# Patient Record
Sex: Female | Born: 1940 | Race: White | Hispanic: No | Marital: Married | Smoking: Former smoker
Health system: Southern US, Community
[De-identification: ages and names within clinical notes are randomized; demographics above are authoritative.]

## PROBLEM LIST (undated history)

## (undated) DIAGNOSIS — F419 Anxiety disorder, unspecified: Secondary | ICD-10-CM

## (undated) DIAGNOSIS — K219 Gastro-esophageal reflux disease without esophagitis: Secondary | ICD-10-CM

## (undated) DIAGNOSIS — E785 Hyperlipidemia, unspecified: Secondary | ICD-10-CM

## (undated) DIAGNOSIS — F32A Depression, unspecified: Secondary | ICD-10-CM

## (undated) DIAGNOSIS — F329 Major depressive disorder, single episode, unspecified: Secondary | ICD-10-CM

## (undated) DIAGNOSIS — M549 Dorsalgia, unspecified: Secondary | ICD-10-CM

## (undated) DIAGNOSIS — M810 Age-related osteoporosis without current pathological fracture: Secondary | ICD-10-CM

## (undated) DIAGNOSIS — I10 Essential (primary) hypertension: Secondary | ICD-10-CM

## (undated) HISTORY — PX: TUBAL LIGATION: SHX77

## (undated) HISTORY — PX: BLADDER SUSPENSION: SHX72

## (undated) HISTORY — PX: DILATION AND CURETTAGE OF UTERUS: SHX78

## (undated) HISTORY — PX: COLONOSCOPY: SHX174

## (undated) HISTORY — PX: OTHER SURGICAL HISTORY: SHX169

## (undated) HISTORY — PX: ABDOMINAL HYSTERECTOMY: SHX81

---

## 2004-04-11 ENCOUNTER — Ambulatory Visit: Payer: Self-pay | Admitting: Unknown Physician Specialty

## 2004-05-06 ENCOUNTER — Ambulatory Visit: Payer: Self-pay | Admitting: Unknown Physician Specialty

## 2004-05-09 ENCOUNTER — Ambulatory Visit: Payer: Self-pay | Admitting: Unknown Physician Specialty

## 2005-07-10 ENCOUNTER — Ambulatory Visit: Payer: Self-pay | Admitting: Unknown Physician Specialty

## 2006-07-14 ENCOUNTER — Ambulatory Visit: Payer: Self-pay | Admitting: Unknown Physician Specialty

## 2007-01-25 ENCOUNTER — Ambulatory Visit: Payer: Self-pay | Admitting: Unknown Physician Specialty

## 2007-07-16 ENCOUNTER — Ambulatory Visit: Payer: Self-pay | Admitting: Unknown Physician Specialty

## 2008-07-19 ENCOUNTER — Ambulatory Visit: Payer: Self-pay | Admitting: Unknown Physician Specialty

## 2008-09-21 ENCOUNTER — Emergency Department: Payer: Self-pay | Admitting: Emergency Medicine

## 2009-03-06 ENCOUNTER — Ambulatory Visit: Payer: Self-pay | Admitting: Unknown Physician Specialty

## 2009-08-01 ENCOUNTER — Ambulatory Visit: Payer: Self-pay

## 2009-08-17 ENCOUNTER — Emergency Department: Payer: Self-pay | Admitting: Emergency Medicine

## 2009-08-22 ENCOUNTER — Ambulatory Visit: Payer: Self-pay | Admitting: Unknown Physician Specialty

## 2009-08-29 ENCOUNTER — Ambulatory Visit: Payer: Self-pay | Admitting: Unknown Physician Specialty

## 2009-08-31 ENCOUNTER — Ambulatory Visit: Payer: Self-pay | Admitting: Unknown Physician Specialty

## 2009-09-03 ENCOUNTER — Ambulatory Visit: Payer: Self-pay | Admitting: Unknown Physician Specialty

## 2009-09-05 ENCOUNTER — Inpatient Hospital Stay: Payer: Self-pay | Admitting: Unknown Physician Specialty

## 2009-09-25 ENCOUNTER — Emergency Department: Payer: Self-pay | Admitting: Emergency Medicine

## 2010-08-05 ENCOUNTER — Ambulatory Visit: Payer: Self-pay | Admitting: Unknown Physician Specialty

## 2011-08-06 ENCOUNTER — Ambulatory Visit: Payer: Self-pay | Admitting: Internal Medicine

## 2012-08-10 ENCOUNTER — Ambulatory Visit: Payer: Self-pay | Admitting: Unknown Physician Specialty

## 2012-09-17 ENCOUNTER — Ambulatory Visit: Payer: Self-pay | Admitting: Internal Medicine

## 2014-03-02 ENCOUNTER — Ambulatory Visit: Payer: Self-pay | Admitting: Internal Medicine

## 2014-07-13 ENCOUNTER — Ambulatory Visit: Admit: 2014-07-13 | Disposition: A | Discharge status: Home / Self Care | Payer: Self-pay | Admitting: Internal Medicine

## 2014-07-13 ENCOUNTER — Ambulatory Visit
Admit: 2014-07-13 | Disposition: A | Discharge status: Home / Self Care | Payer: Self-pay | Attending: Internal Medicine | Admitting: Internal Medicine

## 2014-07-26 ENCOUNTER — Ambulatory Visit: Admit: 2014-07-26 | Disposition: A | Discharge status: Home / Self Care | Payer: Self-pay | Admitting: Internal Medicine

## 2016-06-25 ENCOUNTER — Other Ambulatory Visit: Payer: Self-pay | Admitting: Internal Medicine

## 2016-06-25 DIAGNOSIS — Z1231 Encounter for screening mammogram for malignant neoplasm of breast: Secondary | ICD-10-CM

## 2016-07-28 ENCOUNTER — Ambulatory Visit
Admission: RE | Admit: 2016-07-28 | Discharge: 2016-07-28 | Disposition: A | Discharge status: Home / Self Care | Payer: Medicare Other | Source: Ambulatory Visit | Attending: Internal Medicine | Admitting: Internal Medicine

## 2016-07-28 DIAGNOSIS — Z1231 Encounter for screening mammogram for malignant neoplasm of breast: Secondary | ICD-10-CM | POA: Diagnosis present

## 2017-09-02 ENCOUNTER — Other Ambulatory Visit: Payer: Self-pay | Admitting: Internal Medicine

## 2017-09-02 DIAGNOSIS — Z1231 Encounter for screening mammogram for malignant neoplasm of breast: Secondary | ICD-10-CM

## 2017-09-21 ENCOUNTER — Ambulatory Visit
Admission: RE | Admit: 2017-09-21 | Discharge: 2017-09-21 | Disposition: A | Discharge status: Home / Self Care | Payer: Medicare Other | Source: Ambulatory Visit | Attending: Internal Medicine | Admitting: Internal Medicine

## 2017-09-21 DIAGNOSIS — Z1231 Encounter for screening mammogram for malignant neoplasm of breast: Secondary | ICD-10-CM

## 2018-05-07 ENCOUNTER — Encounter: Payer: Self-pay | Admitting: *Deleted

## 2018-05-10 ENCOUNTER — Ambulatory Visit: Payer: Medicare Other | Admitting: Anesthesiology

## 2018-05-10 ENCOUNTER — Ambulatory Visit
Admission: RE | Admit: 2018-05-10 | Discharge: 2018-05-10 | Disposition: A | Discharge status: Home / Self Care | Payer: Medicare Other | Attending: Unknown Physician Specialty | Admitting: Unknown Physician Specialty

## 2018-05-10 ENCOUNTER — Encounter: Payer: Self-pay | Admitting: Anesthesiology

## 2018-05-10 ENCOUNTER — Encounter
Admission: RE | Disposition: A | Discharge status: Home / Self Care | Payer: Self-pay | Source: Home / Self Care | Attending: Unknown Physician Specialty

## 2018-05-10 DIAGNOSIS — K640 First degree hemorrhoids: Secondary | ICD-10-CM | POA: Insufficient documentation

## 2018-05-10 DIAGNOSIS — F329 Major depressive disorder, single episode, unspecified: Secondary | ICD-10-CM | POA: Insufficient documentation

## 2018-05-10 DIAGNOSIS — F419 Anxiety disorder, unspecified: Secondary | ICD-10-CM | POA: Diagnosis not present

## 2018-05-10 DIAGNOSIS — Z1211 Encounter for screening for malignant neoplasm of colon: Secondary | ICD-10-CM | POA: Insufficient documentation

## 2018-05-10 DIAGNOSIS — Z87891 Personal history of nicotine dependence: Secondary | ICD-10-CM | POA: Diagnosis not present

## 2018-05-10 DIAGNOSIS — K5730 Diverticulosis of large intestine without perforation or abscess without bleeding: Secondary | ICD-10-CM | POA: Insufficient documentation

## 2018-05-10 DIAGNOSIS — I10 Essential (primary) hypertension: Secondary | ICD-10-CM | POA: Diagnosis not present

## 2018-05-10 DIAGNOSIS — Z86010 Personal history of colonic polyps: Secondary | ICD-10-CM | POA: Insufficient documentation

## 2018-05-10 DIAGNOSIS — Z79899 Other long term (current) drug therapy: Secondary | ICD-10-CM | POA: Diagnosis not present

## 2018-05-10 HISTORY — DX: Gastro-esophageal reflux disease without esophagitis: K21.9

## 2018-05-10 HISTORY — DX: Major depressive disorder, single episode, unspecified: F32.9

## 2018-05-10 HISTORY — DX: Anxiety disorder, unspecified: F41.9

## 2018-05-10 HISTORY — DX: Dorsalgia, unspecified: M54.9

## 2018-05-10 HISTORY — DX: Hyperlipidemia, unspecified: E78.5

## 2018-05-10 HISTORY — DX: Age-related osteoporosis without current pathological fracture: M81.0

## 2018-05-10 HISTORY — PX: COLONOSCOPY WITH PROPOFOL: SHX5780

## 2018-05-10 HISTORY — DX: Essential (primary) hypertension: I10

## 2018-05-10 HISTORY — DX: Depression, unspecified: F32.A

## 2018-05-10 SURGERY — COLONOSCOPY WITH PROPOFOL
Anesthesia: General

## 2018-05-10 MED ORDER — SODIUM CHLORIDE 0.9 % IV SOLN: INTRAVENOUS | Status: DC

## 2018-05-10 MED ORDER — PROPOFOL 10 MG/ML IV BOLUS
INTRAVENOUS | Status: DC | PRN
  Administered 2018-05-10: 20 mg via INTRAVENOUS
  Administered 2018-05-10: 50 mg via INTRAVENOUS

## 2018-05-10 MED ORDER — LIDOCAINE HCL (CARDIAC) PF 100 MG/5ML IV SOSY
PREFILLED_SYRINGE | INTRAVENOUS | Status: DC | PRN
  Administered 2018-05-10: 60 mg via INTRAVENOUS

## 2018-05-10 MED ORDER — PROPOFOL 500 MG/50ML IV EMUL
INTRAVENOUS | Status: AC
  Filled 2018-05-10: qty 50

## 2018-05-10 MED ORDER — LIDOCAINE HCL (PF) 2 % IJ SOLN
INTRAMUSCULAR | Status: AC
  Filled 2018-05-10: qty 10

## 2018-05-10 MED ORDER — SODIUM CHLORIDE 0.9 % IV SOLN
INTRAVENOUS | Status: DC
  Administered 2018-05-10: 15:00:00 via INTRAVENOUS

## 2018-05-10 MED ORDER — PROPOFOL 500 MG/50ML IV EMUL
INTRAVENOUS | Status: DC | PRN
  Administered 2018-05-10: 120 ug/kg/min via INTRAVENOUS

## 2018-05-10 NOTE — H&P (Signed)
Primary Care Physician:  Barbette ReichmannHande, Vishwanath, MD Primary Gastroenterologist:  Dr. Mechele CollinElliott  Pre-Procedure History & Physical: HPI:  Monica Franco is a 78 y.o. female is here for an colonoscopy.   Past Medical History:  Diagnosis Date  . Anxiety   . Back pain   . Depression   . Elevated lipids   . GERD (gastroesophageal reflux disease)   . Hypertension   . Osteoporosis     Past Surgical History:  Procedure Laterality Date  . ABDOMINAL HYSTERECTOMY    . BLADDER SUSPENSION    . COLONOSCOPY    . DILATION AND CURETTAGE OF UTERUS    . sigmoidscopy    . TUBAL LIGATION      Prior to Admission medications   Medication Sig Start Date End Date Taking? Authorizing Provider  clonazePAM (KLONOPIN) 0.5 MG tablet Take 0.5 mg by mouth 2 (two) times daily as needed for anxiety.   Yes [provider]  clotrimazole (LOTRIMIN) 1 % external solution Apply 1 application topically 2 (two) times daily.   Yes [provider]  ezetimibe (ZETIA) 10 MG tablet Take 10 mg by mouth daily.   Yes [provider]  hydrochlorothiazide (HYDRODIURIL) 25 MG tablet Take 25 mg by mouth daily.   Yes [provider]  losartan (COZAAR) 100 MG tablet Take 100 mg by mouth daily.   Yes [provider]  oxyCODONE-acetaminophen (PERCOCET/ROXICET) 5-325 MG tablet Take by mouth every 4 (four) hours as needed for severe pain.   Yes [provider]  PARoxetine (PAXIL) 30 MG tablet Take 30 mg by mouth daily.   Yes [provider]  potassium chloride (K-DUR,KLOR-CON) 10 MEQ tablet Take 10 mEq by mouth 2 (two) times daily.   Yes [provider]  pravastatin (PRAVACHOL) 20 MG tablet Take 20 mg by mouth daily.   Yes [provider]  triamcinolone cream (KENALOG) 0.5 % Apply 1 application topically 3 (three) times daily.   Yes [provider]    Allergies as of 05/03/2018  . (Not on File)    Family History  Problem Relation Age of  Onset  . Breast cancer Maternal Grandmother     Social History   Socioeconomic History  . Marital status: Married    Spouse name: Not on file  . Number of children: Not on file  . Years of education: Not on file  . Highest education level: Not on file  Occupational History  . Not on file  Social Needs  . Financial resource strain: Not on file  . Food insecurity:    Worry: Not on file    Inability: Not on file  . Transportation needs:    Medical: Not on file    Non-medical: Not on file  Tobacco Use  . Smoking status: Former Games developermoker  . Smokeless tobacco: Never Used  Substance and Sexual Activity  . Alcohol use: Never    Frequency: Never  . Drug use: Never  . Sexual activity: Not on file  Lifestyle  . Physical activity:    Days per week: Not on file    Minutes per session: Not on file  . Stress: Not on file  Relationships  . Social connections:    Talks on phone: Not on file    Gets together: Not on file    Attends religious service: Not on file    Active member of club or organization: Not on file    Attends meetings of clubs or organizations: Not on  file    Relationship status: Not on file  . Intimate partner violence:    Fear of current or ex partner: Not on file    Emotionally abused: Not on file    Physically abused: Not on file    Forced sexual activity: Not on file  Other Topics Concern  . Not on file  Social History Narrative  . Not on file    Review of Systems: See HPI, otherwise negative ROS  Physical Exam: BP (!) 155/84   Pulse 76   Temp (!) 96.5 F (35.8 C) (Tympanic)   Resp 20   Ht 5\' 1"  (1.549 m)   Wt 63.5 kg   SpO2 100%   BMI 26.45 kg/m  General:   Alert,  pleasant and cooperative in NAD Head:  Normocephalic and atraumatic. Neck:  Supple; no masses or thyromegaly. Lungs:  Clear throughout to auscultation.    Heart:  Regular rate and rhythm. Abdomen:  Soft, nontender and nondistended. Normal bowel sounds, without guarding, and without  rebound.   Neurologic:  Alert and  oriented x4;  grossly normal neurologically.  Impression/Plan: Monica Franco is here for an colonoscopy to be performed for [facility] colon polyps.  Risks, benefits, limitations, and alternatives regarding  colonoscopy have been reviewed with the patient.  Questions have been answered.  All parties agreeable.   Lynnae Prude, MD  05/10/2018, 3:43 PM

## 2018-05-10 NOTE — Anesthesia Post-op Follow-up Note (Signed)
Anesthesia QCDR form completed.        

## 2018-05-10 NOTE — Transfer of Care (Signed)
Immediate Anesthesia Transfer of Care Note  Patient: Monica Franco  Procedure(s) Performed: COLONOSCOPY WITH PROPOFOL (N/A )  Patient Location: PACU  Anesthesia Type:General  Level of Consciousness: sedated  Airway & Oxygen Therapy: Patient Spontanous Breathing and Patient connected to nasal cannula oxygen  Post-op Assessment: Report given to RN and Post -op Vital signs reviewed and stable  Post vital signs: Reviewed and stable  Last Vitals:  Vitals Value Taken Time  BP 153/76 05/10/2018  4:20 PM  Temp 36.1 C 05/10/2018  4:20 PM  Pulse 59 05/10/2018  4:21 PM  Resp 19 05/10/2018  4:21 PM  SpO2 100 % 05/10/2018  4:21 PM  Vitals shown include unvalidated device data.  Last Pain:  Vitals:   05/10/18 1620  TempSrc: Tympanic  PainSc:       Patients Stated Pain Goal: 0 (05/10/18 1458)  Complications: No apparent anesthesia complications

## 2018-05-10 NOTE — Anesthesia Postprocedure Evaluation (Signed)
Anesthesia Post Note  Patient: Monica Franco  Procedure(s) Performed: COLONOSCOPY WITH PROPOFOL (N/A )  Patient location during evaluation: Endoscopy Anesthesia Type: General Level of consciousness: awake and alert Pain management: pain level controlled Vital Signs Assessment: post-procedure vital signs reviewed and stable Respiratory status: spontaneous breathing and respiratory function stable Cardiovascular status: stable Anesthetic complications: no     Last Vitals:  Vitals:   05/10/18 1501 05/10/18 1620  BP:  (!) 153/76  Pulse: 76 60  Resp: 20 (!) 21  Temp:  (!) 36.1 C  SpO2:  100%    Last Pain:  Vitals:   05/10/18 1620  TempSrc: Tympanic  PainSc:                  KEPHART,WILLIAM K

## 2018-05-10 NOTE — Progress Notes (Signed)
IV site without bleeding when IV dc'd. Noted small hematoma at left hand IV site after patient dressed for discharge. PRessure held for 10 min. To left hand, And small ice pack applied. Bruise noted and bandage applied. Pt. Knows to call MD if site doesn't heal over the next week. She knows there will be a bruise there, and agrees.

## 2018-05-10 NOTE — Anesthesia Preprocedure Evaluation (Signed)
Anesthesia Evaluation  Patient identified by MRN, date of birth, ID band Patient awake    Reviewed: Allergy & Precautions, NPO status , Patient's Chart, lab work & pertinent test results, reviewed documented beta blocker date and time   Airway Mallampati: II  TM Distance: >3 FB     Dental  (+) Chipped   Pulmonary former smoker,           Cardiovascular hypertension, Pt. on medications      Neuro/Psych Anxiety    GI/Hepatic   Endo/Other    Renal/GU      Musculoskeletal   Abdominal   Peds  Hematology   Anesthesia Other Findings   Reproductive/Obstetrics                             Anesthesia Physical Anesthesia Plan  ASA: II  Anesthesia Plan: General   Post-op Pain Management:    Induction: Intravenous  PONV Risk Score and Plan:   Airway Management Planned:   Additional Equipment:   Intra-op Plan:   Post-operative Plan:   Informed Consent: I have reviewed the patients History and Physical, chart, labs and discussed the procedure including the risks, benefits and alternatives for the proposed anesthesia with the patient or authorized representative who has indicated his/her understanding and acceptance.       Plan Discussed with: CRNA  Anesthesia Plan Comments:         Anesthesia Quick Evaluation

## 2018-05-10 NOTE — Op Note (Signed)
[facility] Gastroenterology Patient Name: Saidie Layer Procedure Date: 05/10/2018 3:50 PM MRN: [id number] Account #: [id number] Date of Birth: 04-17-1940 Admit Type: Outpatient Age: 78 Room: [facility] ENDO ROOM 1 Gender: Female Note Status: Finalized Procedure:            Colonoscopy Indications:          High risk colon cancer surveillance: Personal history                        of colonic polyps Providers:            Scot Jun, MD Medicines:            Propofol per Anesthesia Complications:        No immediate complications. Procedure:            Pre-Anesthesia Assessment:                       - After reviewing the risks and benefits, the patient                        was deemed in satisfactory condition to undergo the                        procedure.                       After obtaining informed consent, the colonoscope was                        passed under direct vision. Throughout the procedure,                        the patient's blood pressure, pulse, and oxygen                        saturations were monitored continuously. The                        colonoscopy was performed without difficulty. The                        patient tolerated the procedure well. The quality of                        the bowel preparation was good. The was introduced                        through the anus and advanced to the the cecum,                        identified by appendiceal orifice and ileocecal valve. Findings:      The patient scope was passed to the cecum and the two assistants       verified this. Picture inadvertantly not done.      A few small-mouthed diverticula were found in the sigmoid colon.      Internal hemorrhoids were found during endoscopy. The hemorrhoids were       small and Grade I (internal hemorrhoids that do not prolapse).      The exam was otherwise without abnormality. Impression:           -  Diverticulosis in the sigmoid  colon.                       - Internal hemorrhoids.                       - The examination was otherwise normal.                       - No specimens collected. Recommendation:       - Repeat colonoscopy in 5 years for surveillance. If                        health not in good shape then do not repeat the test. Scot Junobert T Elliott, MD 05/10/2018 4:25:24 PM This report has been signed electronically. Number of Addenda: 0 Note Initiated On: 05/10/2018 3:50 PM Scope Withdrawal Time: 0 hours 7 minutes 30 seconds  Total Procedure Duration: 0 hours 19 minutes 36 seconds       [facility]

## 2018-05-12 ENCOUNTER — Encounter: Payer: Self-pay | Admitting: Unknown Physician Specialty

## 2018-10-27 ENCOUNTER — Other Ambulatory Visit: Payer: Self-pay | Admitting: Internal Medicine

## 2018-10-27 DIAGNOSIS — Z1231 Encounter for screening mammogram for malignant neoplasm of breast: Secondary | ICD-10-CM

## 2018-12-17 ENCOUNTER — Ambulatory Visit
Admission: RE | Admit: 2018-12-17 | Discharge: 2018-12-17 | Disposition: A | Discharge status: Home / Self Care | Payer: Medicare Other | Source: Ambulatory Visit | Attending: Internal Medicine | Admitting: Internal Medicine

## 2018-12-17 DIAGNOSIS — Z1231 Encounter for screening mammogram for malignant neoplasm of breast: Secondary | ICD-10-CM | POA: Insufficient documentation

## 2019-07-27 ENCOUNTER — Ambulatory Visit: Payer: Medicare Other | Admitting: Dermatology

## 2020-01-19 ENCOUNTER — Other Ambulatory Visit: Payer: Self-pay | Admitting: Internal Medicine

## 2020-01-19 DIAGNOSIS — Z1231 Encounter for screening mammogram for malignant neoplasm of breast: Secondary | ICD-10-CM

## 2020-03-23 ENCOUNTER — Ambulatory Visit
Admission: RE | Admit: 2020-03-23 | Discharge: 2020-03-23 | Disposition: A | Discharge status: Home / Self Care | Payer: Medicare Other | Source: Ambulatory Visit | Attending: Internal Medicine | Admitting: Internal Medicine

## 2020-03-23 ENCOUNTER — Other Ambulatory Visit: Payer: Self-pay

## 2020-03-23 DIAGNOSIS — Z1231 Encounter for screening mammogram for malignant neoplasm of breast: Secondary | ICD-10-CM | POA: Diagnosis present

## 2020-10-13 IMAGING — MG MM DIGITAL SCREENING BILAT W/ TOMO W/ CAD
6 of 12 series · 6 of 36 positions shown · non-contrast
Comparison: Previous exam(s).

CLINICAL DATA: Screening.

EXAM:
DIGITAL SCREENING BILATERAL MAMMOGRAM WITH TOMO AND CAD

[R MLO synth-2D (1 of 2)]
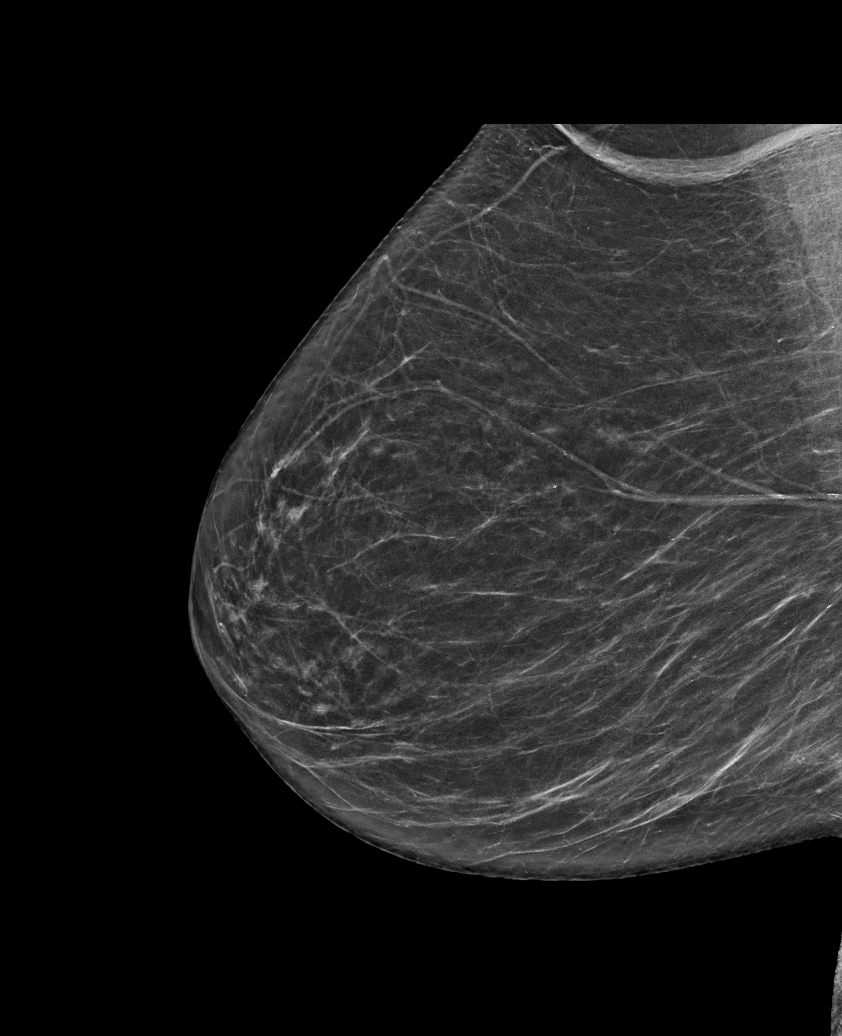

[R CC synth-2D]
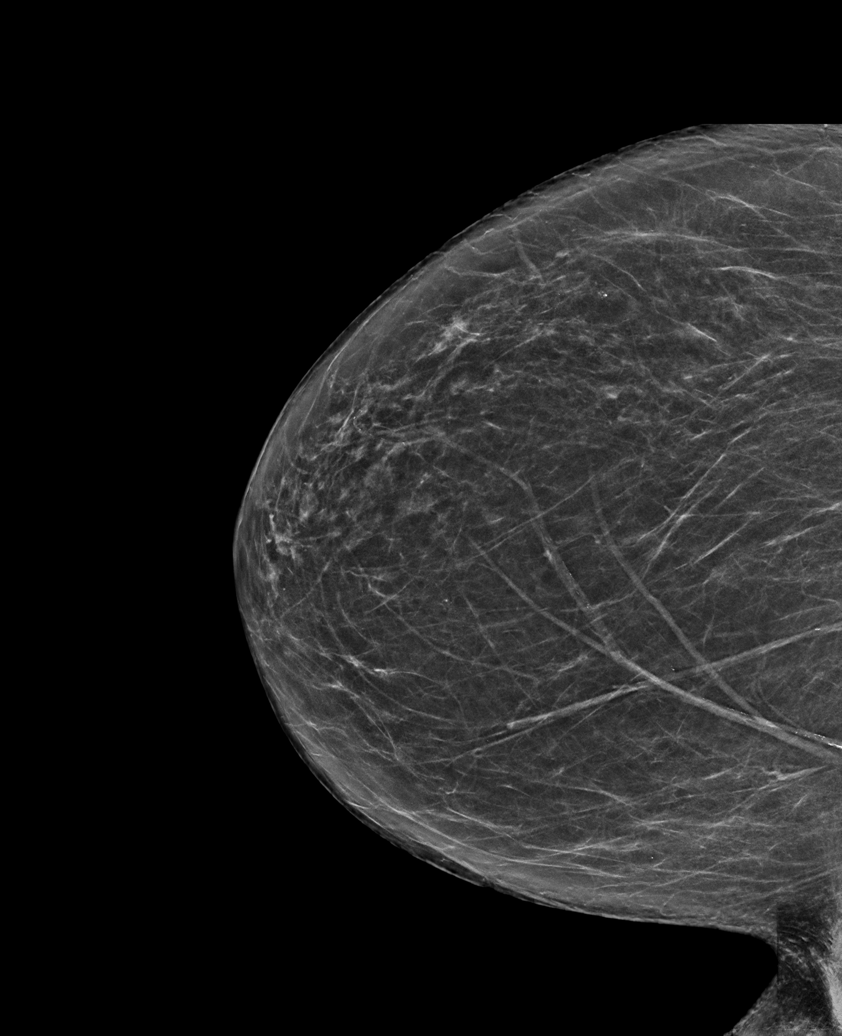

[L CC synth-2D]
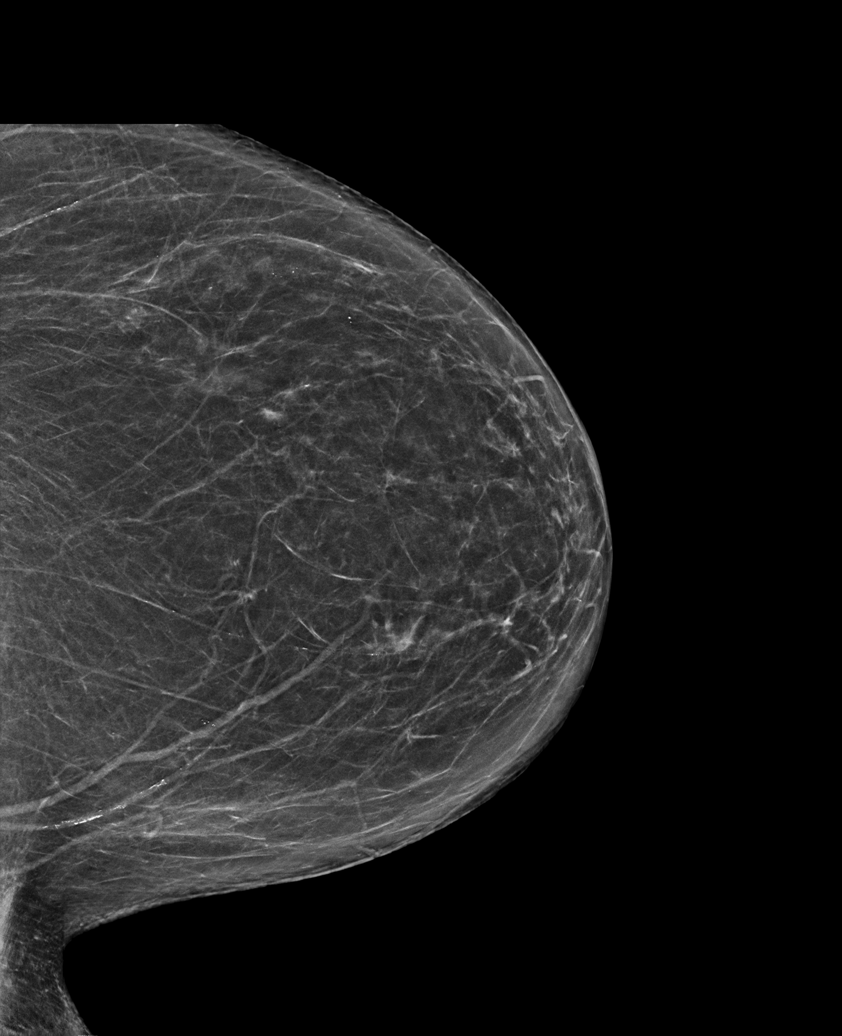

[L MLO synth-2D (1 of 2)]
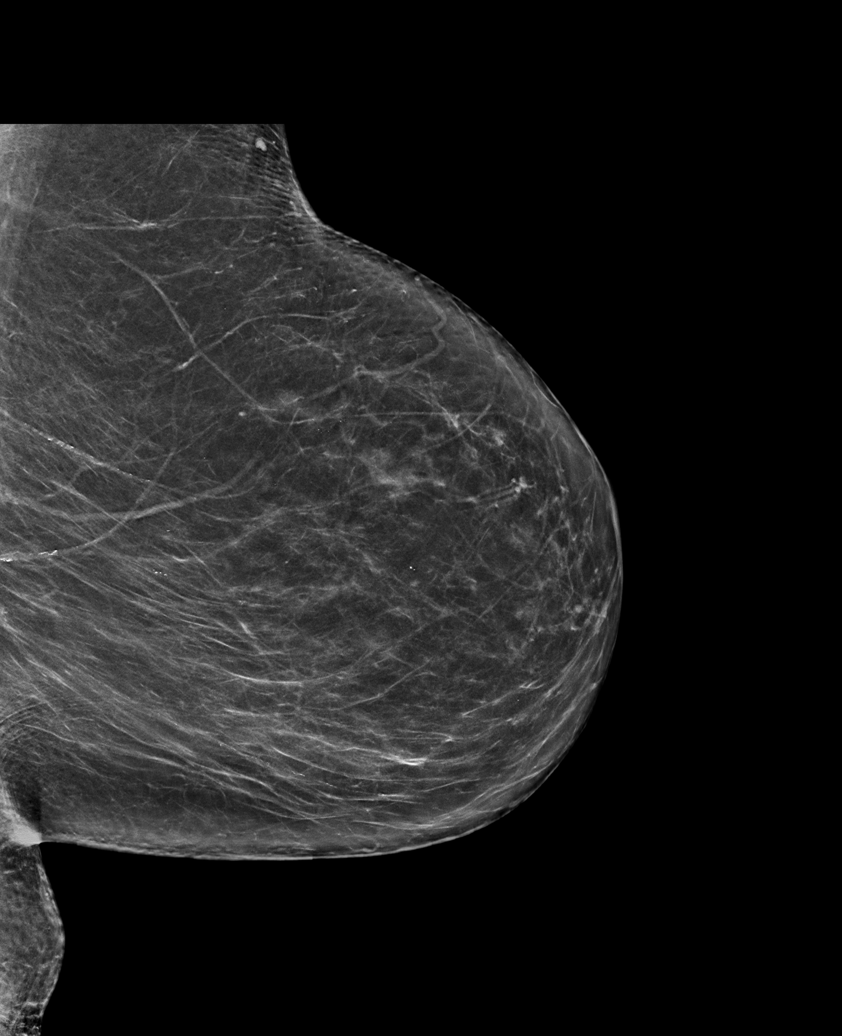

[L MLO synth-2D (2 of 2)]
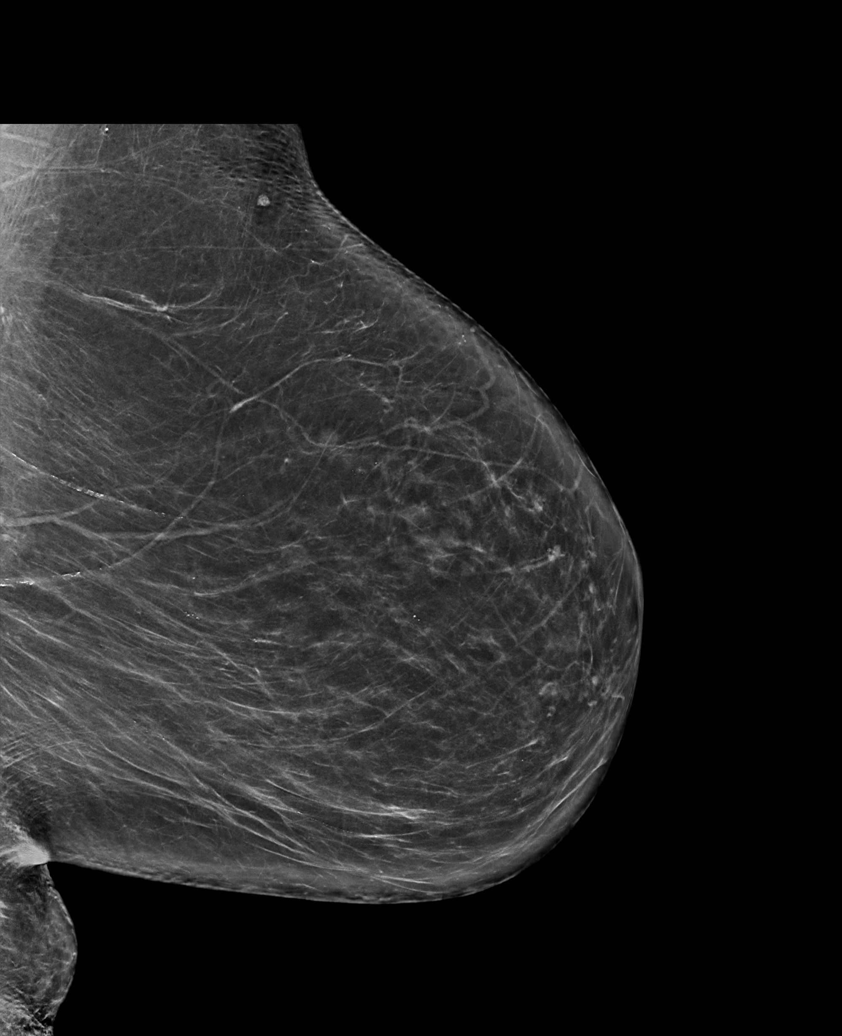

[R MLO synth-2D (2 of 2)]
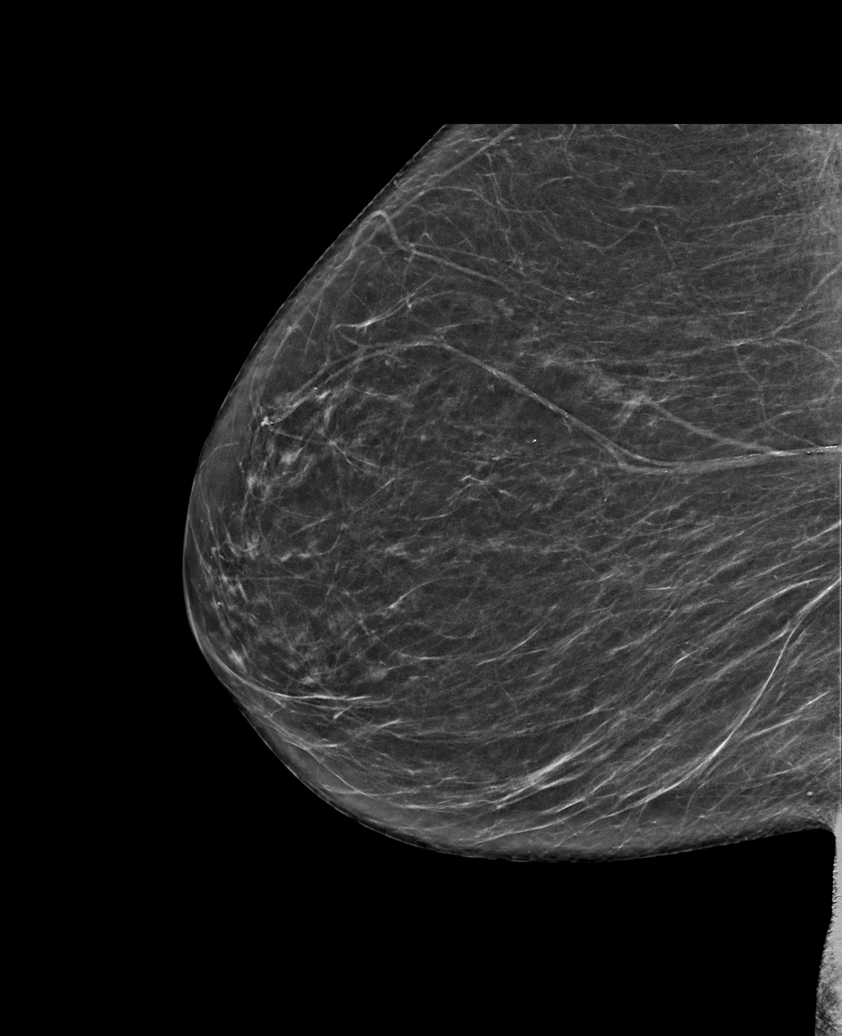

[6 of 36 positions shown; findings below may reference images not displayed]

ACR Breast Density Category b: There are scattered areas of
fibroglandular density.
FINDINGS: There are no findings suspicious for malignancy. Images were
processed with CAD.
IMPRESSION: No mammographic evidence of malignancy. A result letter of this
screening mammogram will be mailed directly to the patient.

RECOMMENDATION:
Screening mammogram in one year. (Code:[id number])

BI-RADS CATEGORY  1: Negative.

## 2021-03-05 ENCOUNTER — Other Ambulatory Visit: Payer: Self-pay | Admitting: Internal Medicine

## 2021-03-05 DIAGNOSIS — Z1231 Encounter for screening mammogram for malignant neoplasm of breast: Secondary | ICD-10-CM

## 2021-03-28 ENCOUNTER — Other Ambulatory Visit: Payer: Self-pay

## 2021-03-28 ENCOUNTER — Ambulatory Visit
Admission: RE | Admit: 2021-03-28 | Discharge: 2021-03-28 | Disposition: A | Discharge status: Home / Self Care | Payer: Medicare Other | Source: Ambulatory Visit | Attending: Internal Medicine | Admitting: Internal Medicine

## 2021-03-28 DIAGNOSIS — Z1231 Encounter for screening mammogram for malignant neoplasm of breast: Secondary | ICD-10-CM | POA: Insufficient documentation

## 2022-02-17 ENCOUNTER — Other Ambulatory Visit: Payer: Self-pay

## 2022-02-17 DIAGNOSIS — Z1231 Encounter for screening mammogram for malignant neoplasm of breast: Secondary | ICD-10-CM

## 2022-05-23 ENCOUNTER — Ambulatory Visit
Admission: RE | Admit: 2022-05-23 | Discharge: 2022-05-23 | Disposition: A | Discharge status: Home / Self Care | Payer: Medicare Other | Source: Ambulatory Visit | Attending: Internal Medicine | Admitting: Internal Medicine

## 2022-05-23 DIAGNOSIS — Z1231 Encounter for screening mammogram for malignant neoplasm of breast: Secondary | ICD-10-CM | POA: Diagnosis not present

## 2023-07-07 ENCOUNTER — Other Ambulatory Visit: Payer: Self-pay | Admitting: Internal Medicine

## 2023-07-07 DIAGNOSIS — Z1231 Encounter for screening mammogram for malignant neoplasm of breast: Secondary | ICD-10-CM

## 2023-07-29 ENCOUNTER — Encounter: Payer: Self-pay | Admitting: Dermatology

## 2023-07-29 ENCOUNTER — Ambulatory Visit: Payer: Medicare Other | Admitting: Dermatology

## 2023-07-29 DIAGNOSIS — L304 Erythema intertrigo: Secondary | ICD-10-CM

## 2023-07-29 DIAGNOSIS — L578 Other skin changes due to chronic exposure to nonionizing radiation: Secondary | ICD-10-CM | POA: Diagnosis not present

## 2023-07-29 DIAGNOSIS — L821 Other seborrheic keratosis: Secondary | ICD-10-CM

## 2023-07-29 DIAGNOSIS — D1801 Hemangioma of skin and subcutaneous tissue: Secondary | ICD-10-CM

## 2023-07-29 DIAGNOSIS — L2089 Other atopic dermatitis: Secondary | ICD-10-CM

## 2023-07-29 DIAGNOSIS — L820 Inflamed seborrheic keratosis: Secondary | ICD-10-CM

## 2023-07-29 DIAGNOSIS — Z1283 Encounter for screening for malignant neoplasm of skin: Secondary | ICD-10-CM | POA: Diagnosis not present

## 2023-07-29 DIAGNOSIS — L814 Other melanin hyperpigmentation: Secondary | ICD-10-CM

## 2023-07-29 DIAGNOSIS — D229 Melanocytic nevi, unspecified: Secondary | ICD-10-CM

## 2023-07-29 DIAGNOSIS — L209 Atopic dermatitis, unspecified: Secondary | ICD-10-CM

## 2023-07-29 DIAGNOSIS — W908XXA Exposure to other nonionizing radiation, initial encounter: Secondary | ICD-10-CM

## 2023-07-29 MED ORDER — MOMETASONE FUROATE 0.1 % EX CREA: 1.0000 | TOPICAL_CREAM | CUTANEOUS | 1 refills | Status: AC

## 2023-07-29 NOTE — Patient Instructions (Signed)

## 2023-07-29 NOTE — Progress Notes (Signed)
 New Patient Visit   Subjective  Monica Franco is a 83 y.o. female who presents for the following: Skin Cancer Screening and Full Body Skin Exam, No hx of skin cancer, no fhx of skin cancer, growth L neck irritating  The patient presents for Total-Body Skin Exam (TBSE) for skin cancer screening and mole check. The patient has spots, moles and lesions to be evaluated, some may be new or changing and the patient may have concern these could be cancer.  The following portions of the chart were reviewed this encounter and updated as appropriate: medications, allergies, medical history  Review of Systems:  No other skin or systemic complaints except as noted in HPI or Assessment and Plan.  Objective  Well appearing patient in no apparent distress; mood and affect are within normal limits.  A full examination was performed including scalp, head, eyes, ears, nose, lips, neck, chest, axillae, abdomen, back, buttocks, bilateral upper extremities, bilateral lower extremities, hands, feet, fingers, toes, fingernails, and toenails. All findings within normal limits unless otherwise noted below.   Relevant physical exam findings are noted in the Assessment and Plan.  L ant lat neck x 1, L inframammary x 17, abdomen x 2, R inframammary x 4 (24) Stuck on waxy paps with erythema  Assessment & Plan   SKIN CANCER SCREENING PERFORMED TODAY.  ACTINIC DAMAGE - Chronic condition, secondary to cumulative UV/sun exposure - diffuse scaly erythematous macules with underlying dyspigmentation - Recommend daily broad spectrum sunscreen SPF 30+ to sun-exposed areas, reapply every 2 hours as needed.  - Staying in the shade or wearing long sleeves, sun glasses (UVA+UVB protection) and wide brim hats (4-inch brim around the entire circumference of the hat) are also recommended for sun protection.  - Call for new or changing lesions.  LENTIGINES, SEBORRHEIC KERATOSES, HEMANGIOMAS - Benign normal skin lesions -  Benign-appearing - Call for any changes  MELANOCYTIC NEVI - Tan-brown and/or pink-flesh-colored symmetric macules and papules - Benign appearing on exam today - Observation - Call clinic for new or changing moles - Recommend daily use of broad spectrum spf 30+ sunscreen to sun-exposed areas.   ATOPIC DERMATITIS L neck Exam: Scaly pink papules  L neck 1% BSA Chronic and persistent condition with duration or expected duration over one year. Condition is symptomatic/ bothersome to patient. Not currently at goal. Atopic dermatitis (eczema) is a chronic, relapsing, pruritic condition that can significantly affect quality of life. It is often associated with allergic rhinitis and/or asthma and can require treatment with topical medications, phototherapy, or in severe cases biologic injectable medication (Dupixent; Adbry) or Oral JAK inhibitors. Treatment Plan: Start Mometasone cr qd up to 5d/wk aa neck prn flares  Topical steroids (such as triamcinolone, fluocinolone, fluocinonide, mometasone, clobetasol, halobetasol, betamethasone, hydrocortisone) can cause thinning and lightening of the skin if they are used for too long in the same area. Your physician has selected the right strength medicine for your problem and area affected on the body. Please use your medication only as directed by your physician to prevent side effects.   Recommend gentle skin care.    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 Condition is symptomatic/ bothersome to patient. Not currently at goal. Intertrigo is a chronic recurrent rash that occurs in skin fold areas that may be associated with friction; heat; moisture; yeast; fungus; and bacteria.  It is exacerbated by increased movement / activity; sweating; and higher atmospheric temperature.  Use of an absorbant powder such as Zeasorb AF powder or other OTC antifungal powder  to the area daily can prevent rash recurrence. Other options to help keep the area dry include blow drying the area after bathing or using antiperspirant products such as Duradry sweat minimizing gel. Treatment Plan: Cont Nystatin/TMC prn as prescribed by PCP  Topical steroids (such as triamcinolone, fluocinolone, fluocinonide, mometasone, clobetasol, halobetasol, betamethasone, hydrocortisone) can cause thinning and lightening of the skin if they are used for too long in the same area. Your physician has selected the right strength medicine for your problem and area affected on the body. Please use your medication only as directed by your physician to prevent side effects.    INFLAMED SEBORRHEIC KERATOSIS (24) L ant lat neck x 1, L inframammary x 17, abdomen x 2, R inframammary x 4 (24) Symptomatic, irritating, patient would like treated. Destruction of lesion - L ant lat neck x 1, L inframammary x 17, abdomen x 2, R inframammary x 4 (24) Complexity: simple   Destruction method: cryotherapy   Informed consent: discussed and consent obtained   Timeout:  patient name, date of birth, surgical site, and procedure verified Lesion destroyed using liquid nitrogen: Yes   Region frozen until ice ball extended beyond lesion: Yes   Outcome: patient tolerated procedure well with no complications   Post-procedure details: wound care instructions given    Return in about 8 months (around 03/29/2024) for Intertrigo/ISKs inframammary.  I, Rollie Clipper, RMA, am acting as scribe for Celine Collard, MD .   Documentation: I have reviewed the above documentation for accuracy and completeness, and I agree with the above.  Celine Collard, MD

## 2023-08-04 ENCOUNTER — Ambulatory Visit
Admission: RE | Admit: 2023-08-04 | Discharge: 2023-08-04 | Disposition: A | Discharge status: Home / Self Care | Source: Ambulatory Visit | Attending: Internal Medicine | Admitting: Internal Medicine

## 2023-08-04 DIAGNOSIS — Z1231 Encounter for screening mammogram for malignant neoplasm of breast: Secondary | ICD-10-CM

## 2024-03-29 ENCOUNTER — Ambulatory Visit: Admitting: Dermatology
